# Patient Record
Sex: Male | Born: 1980 | Race: White | Hispanic: No | Marital: Single | Smoking: Never smoker
Health system: Southern US, Community
[De-identification: ages and names within clinical notes are randomized; demographics above are authoritative.]

## PROBLEM LIST (undated history)

## (undated) DIAGNOSIS — G40909 Epilepsy, unspecified, not intractable, without status epilepticus: Secondary | ICD-10-CM

---

## 2021-07-11 ENCOUNTER — Emergency Department (HOSPITAL_BASED_OUTPATIENT_CLINIC_OR_DEPARTMENT_OTHER)
Admission: EM | Admit: 2021-07-11 | Discharge: 2021-07-11 | Disposition: A | Discharge status: Home / Self Care | Payer: BLUE CROSS/BLUE SHIELD | Attending: Emergency Medicine | Admitting: Emergency Medicine

## 2021-07-11 ENCOUNTER — Emergency Department (HOSPITAL_BASED_OUTPATIENT_CLINIC_OR_DEPARTMENT_OTHER): Payer: BLUE CROSS/BLUE SHIELD

## 2021-07-11 ENCOUNTER — Other Ambulatory Visit: Payer: Self-pay

## 2021-07-11 ENCOUNTER — Encounter (HOSPITAL_BASED_OUTPATIENT_CLINIC_OR_DEPARTMENT_OTHER): Payer: Self-pay | Admitting: Emergency Medicine

## 2021-07-11 DIAGNOSIS — R072 Precordial pain: Secondary | ICD-10-CM | POA: Diagnosis not present

## 2021-07-11 HISTORY — DX: Epilepsy, unspecified, not intractable, without status epilepticus: G40.909

## 2021-07-11 LAB — TROPONIN I (HIGH SENSITIVITY): Troponin I (High Sensitivity): <2 ng/L (ref <18)

## 2021-07-11 MED ORDER — ACETAMINOPHEN 500 MG PO TABS
1000.0000 mg | ORAL_TABLET | Freq: Once | ORAL | Status: AC
  Administered 2021-07-11: 1000 mg via ORAL
  Filled 2021-07-11: qty 2

## 2021-07-11 MED ORDER — ALUM & MAG HYDROXIDE-SIMETH 200-200-20 MG/5ML PO SUSP
30.0000 mL | Freq: Once | ORAL | Status: AC
  Administered 2021-07-11: 30 mL via ORAL
  Filled 2021-07-11: qty 30

## 2021-07-11 MED ORDER — NAPROXEN 375 MG PO TABS: 375.0000 mg | ORAL_TABLET | Freq: Two times a day (BID) | ORAL | 0 refills | Status: AC

## 2021-07-11 NOTE — ED Provider Notes (Signed)
[facility] EMERGENCY DEPT Provider Note   CSN: [id number] Arrival date & time: 07/11/21  0421     History Chief Complaint  Patient presents with   Chest Pain    Jeff Charles is a 40 y.o. male.  The history is provided by the patient.  Chest Pain Pain location:  Substernal area Pain quality: sharp   Pain radiates to:  Does not radiate Pain severity:  Moderate Onset quality:  Sudden Duration:  8 hours Timing:  Constant Progression:  Unchanged Chronicity:  New Context: movement   Relieved by:  Nothing Worsened by:  Nothing Associated symptoms: no back pain, no claudication, no cough, no diaphoresis, no dysphagia, no fatigue, no fever, no heartburn, no lower extremity edema, no orthopnea, no palpitations, no shortness of breath, no syncope, no vomiting and no weakness   Associated symptoms comment:  No leg pain.  No travel, anywhere  No surgery.  No medications Risk factors: male sex   Started in [place].  No travel, no instrumentation.  No leg pain.  Nothing taken for symptoms. No covid symptoms no recent infection     Past Medical History:  Diagnosis Date   Epilepsy (HCC)     There are no problems to display for this patient.   History reviewed. No pertinent surgical history.     History reviewed. No pertinent family history.  Social History   Tobacco Use   Smoking status: Never   Smokeless tobacco: Never  Vaping Use   Vaping Use: Never used  Substance Use Topics   Drug use: Never    Home Medications Prior to Admission medications   Not on File    Allergies    Patient has no allergy information on record.  Review of Systems   Review of Systems  Constitutional:  Negative for diaphoresis, fatigue and fever.  HENT:  Negative for drooling and trouble swallowing.   Eyes:  Negative for redness.  Respiratory:  Negative for cough and shortness of breath.   Cardiovascular:  Positive for chest pain. Negative for palpitations, orthopnea,  claudication, leg swelling and syncope.  Gastrointestinal:  Negative for heartburn and vomiting.  Genitourinary:  Negative for difficulty urinating.  Musculoskeletal:  Negative for back pain.  Skin:  Negative for wound.  Neurological:  Negative for facial asymmetry and weakness.  Psychiatric/Behavioral:  Negative for agitation.    Physical Exam Updated Vital Signs BP (!) 138/94 (BP Location: Right Arm)   Pulse 94   Temp 98 F (36.7 C) (Oral)   Resp 18   Ht 6\' 2"  (1.88 m)   Wt 81.6 kg   SpO2 99%   BMI 23.11 kg/m   Physical Exam Vitals and nursing note reviewed.  Constitutional:      General: He is not in acute distress.    Appearance: Normal appearance.  HENT:     Head: Normocephalic and atraumatic.     Nose: Nose normal.  Eyes:     Conjunctiva/sclera: Conjunctivae normal.     Pupils: Pupils are equal, round, and reactive to light.  Cardiovascular:     Rate and Rhythm: Normal rate and regular rhythm.     Pulses: Normal pulses.     Heart sounds: Normal heart sounds.  Pulmonary:     Effort: Pulmonary effort is normal.     Breath sounds: Normal breath sounds.  Abdominal:     General: Abdomen is flat. Bowel sounds are normal.     Palpations: Abdomen is soft.     Tenderness: There  is no abdominal tenderness. There is no guarding.  Musculoskeletal:        General: Normal range of motion.     Cervical back: Normal range of motion and neck supple.  Skin:    General: Skin is warm and dry.     Capillary Refill: Capillary refill takes less than 2 seconds.  Neurological:     General: No focal deficit present.     Mental Status: He is alert and oriented to person, place, and time.     Deep Tendon Reflexes: Reflexes normal.  Psychiatric:        Mood and Affect: Mood normal.        Behavior: Behavior normal.    ED Results / Procedures / Treatments   Labs (all labs ordered are listed, but only abnormal results are displayed) Labs Reviewed  TROPONIN I (HIGH SENSITIVITY)     EKG  EKG Interpretation  Date/Time:  Tuesday July 11 2021 04:30:52 EDT Ventricular Rate:  92 PR Interval:  129 QRS Duration: 91 QT Interval:  352 QTC Calculation: 436 R Axis:   67 Text Interpretation: Sinus rhythm Confirmed by Nicanor Alcon, Jadene Stemmer ([id number]) on 07/11/2021 5:05:37 AM           Radiology No results found.  Procedures Procedures   Medications Ordered in ED Medications  acetaminophen (TYLENOL) tablet 1,000 mg (has no administration in time range)  alum & mag hydroxide-simeth (MAALOX/MYLANTA) 200-200-20 MG/5ML suspension 30 mL (has no administration in time range)    ED Course  I have reviewed the triage vital signs and the nursing notes.  Pertinent labs & imaging results that were available during my care of the patient were reviewed by me and considered in my medical decision making (see chart for details).  PERC negative wells 0, highly doubt PE in this low risk patient.  Given time course one negative EKG and troponin is sufficient to rule out ACS.   HEART score 1 very low risk for MACE  Jeff Charles was evaluated in Emergency Department on 07/11/2021 for the symptoms described in the history of present illness. He was evaluated in the context of the global COVID-19 pandemic, which necessitated consideration that the patient might be at risk for infection with the SARS-CoV-2 virus that causes COVID-19. Institutional protocols and algorithms that pertain to the evaluation of patients at risk for COVID-19 are in a state of rapid change based on information released by regulatory bodies including the CDC and federal and state organizations. These policies and algorithms were followed during the patient's care in the ED.  Final Clinical Impression(s) / ED Diagnoses Final diagnoses:  None     Return for intractable cough, coughing up blood, fevers > 100.4 unrelieved by medication, shortness of breath, intractable vomiting, chest pain, shortness of breath,  weakness, numbness, changes in speech, facial asymmetry, abdominal pain, passing out, Inability to tolerate liquids or food, cough, altered mental status or any concerns. No signs of systemic illness or infection. The patient is nontoxic-appearing on exam and vital signs are within normal limits. I have reviewed the triage vital signs and the nursing notes. Pertinent labs & imaging results that were available during my care of the patient were reviewed by me and considered in my medical decision making (see chart for details). After history, exam, and medical workup I feel the patient has been appropriately medically screened and is safe for discharge home. Pertinent diagnoses were discussed with the patient. Patient was given return precautions.    Rx /  DC Orders ED Discharge Orders     None        Kahlen Boyde, MD 07/11/21 [id number]

## 2021-07-11 NOTE — ED Triage Notes (Signed)
Pt via pov from home with chest pain when he takes a deep breath. Pt denies any sob or other symptoms. Pt states he is going on a business trip and wanted to get it checked out before he went. Pt alert & oriented, nad noted.

## 2022-09-03 IMAGING — DX DG CHEST 1V PORT
1 series · 2 of 2 positions shown · non-contrast
Comparison: None.

CLINICAL DATA: 40-year-old male with chest pain, pleuritic pain.

EXAM:
PORTABLE CHEST 1 VIEW

[Series 1: chest · 0.14mm/px · 2 of 2 slices shown]
[im 1/2]
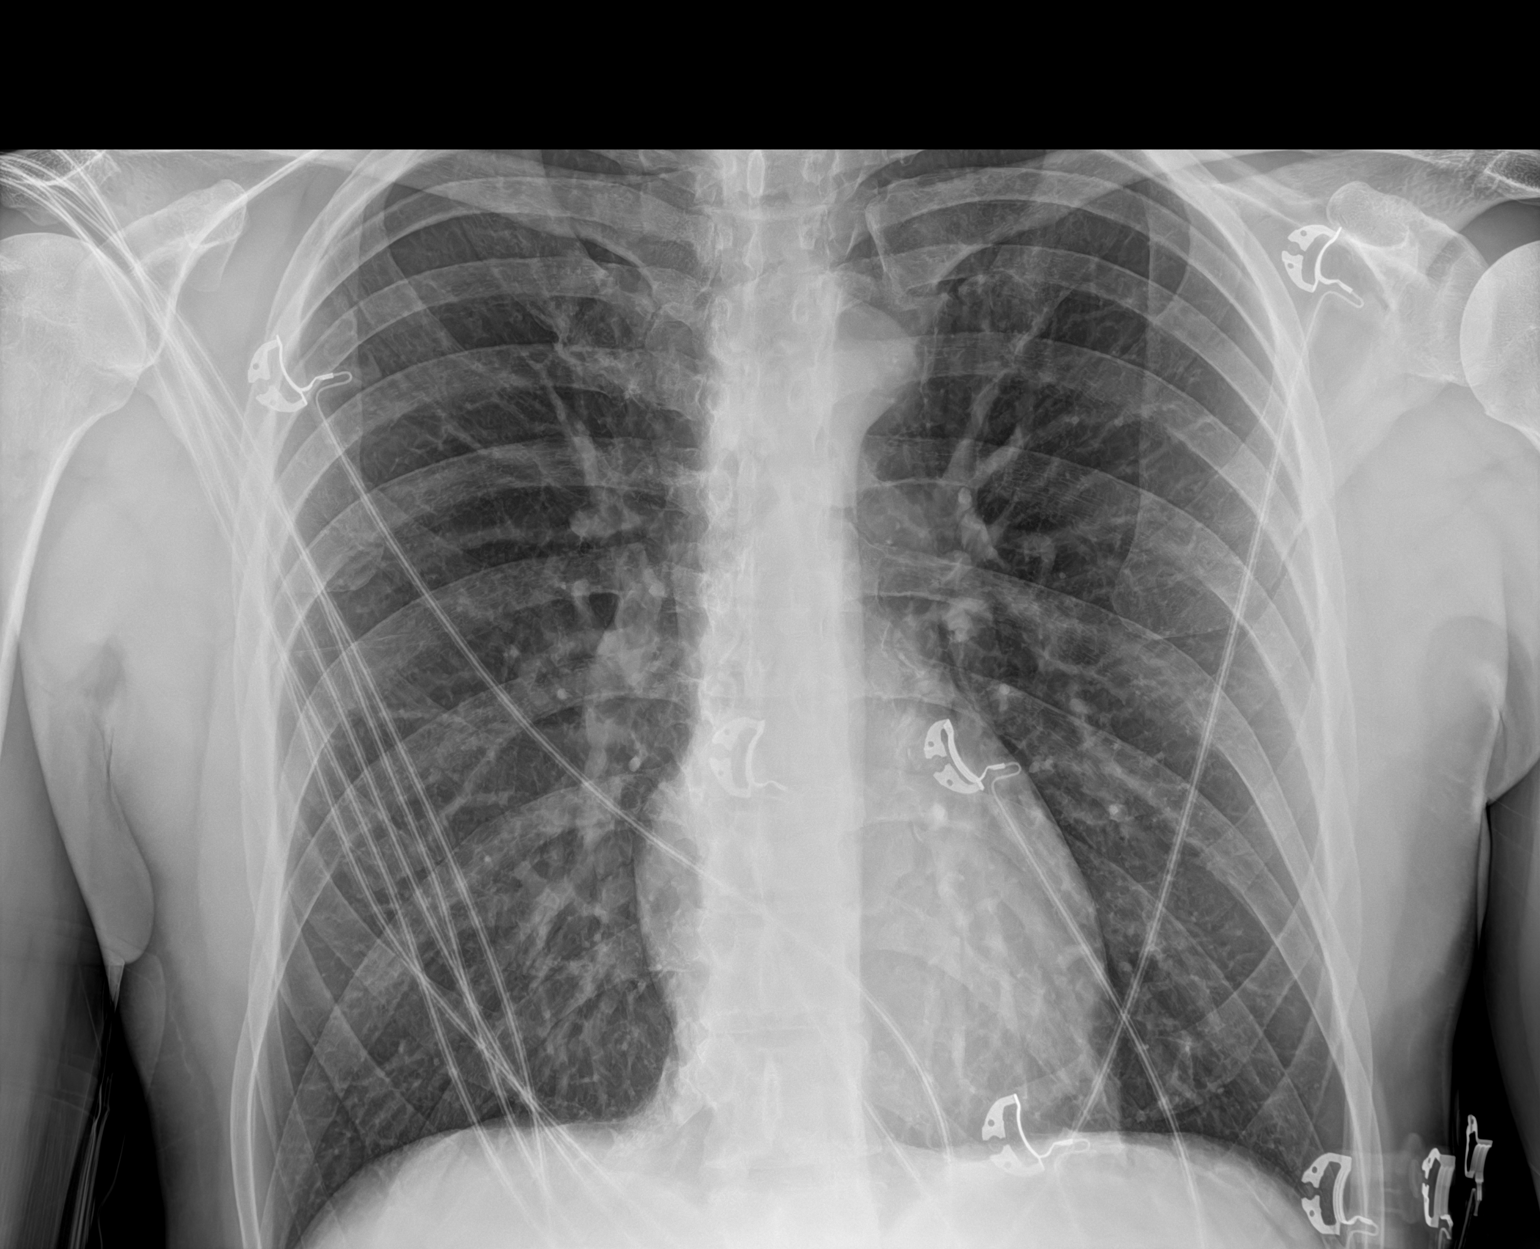
[im 2/2]
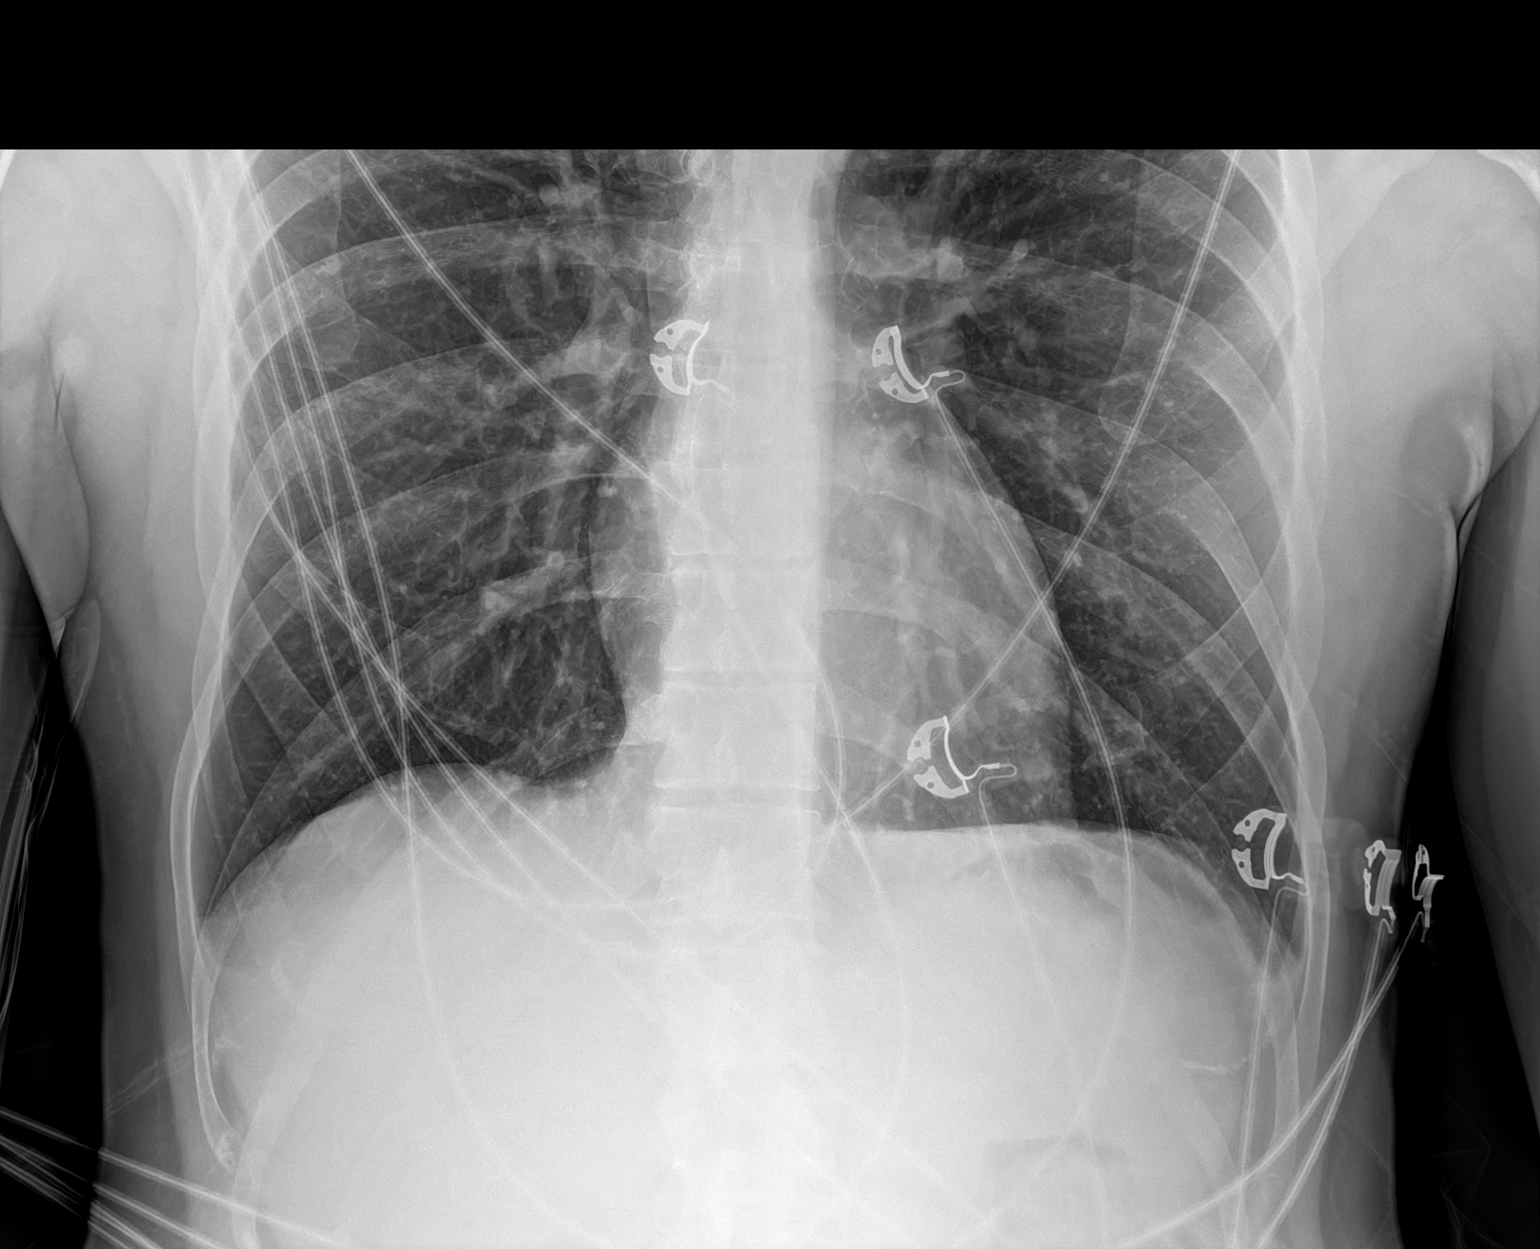

[2 of 2 positions shown; findings below may reference images not displayed]

FINDINGS: Portable AP semi upright view at 2116 hours. Lung volumes and
mediastinal contours are within normal limits. Visualized tracheal
air column is within normal limits. Allowing for portable technique
the lungs are clear. No pneumothorax or pleural effusion.

No osseous abnormality identified.  Negative visible bowel gas.
IMPRESSION: Negative portable chest.

## 2022-10-17 ENCOUNTER — Encounter (HOSPITAL_BASED_OUTPATIENT_CLINIC_OR_DEPARTMENT_OTHER): Payer: Self-pay | Admitting: Emergency Medicine

## 2022-10-17 ENCOUNTER — Emergency Department (HOSPITAL_BASED_OUTPATIENT_CLINIC_OR_DEPARTMENT_OTHER): Payer: BLUE CROSS/BLUE SHIELD

## 2022-10-17 ENCOUNTER — Emergency Department (HOSPITAL_BASED_OUTPATIENT_CLINIC_OR_DEPARTMENT_OTHER)
Admission: EM | Admit: 2022-10-17 | Discharge: 2022-10-17 | Disposition: A | Discharge status: Home / Self Care | Payer: BLUE CROSS/BLUE SHIELD | Attending: Emergency Medicine | Admitting: Emergency Medicine

## 2022-10-17 ENCOUNTER — Other Ambulatory Visit: Payer: Self-pay

## 2022-10-17 DIAGNOSIS — R519 Headache, unspecified: Secondary | ICD-10-CM | POA: Diagnosis not present

## 2022-10-17 DIAGNOSIS — R400 Somnolence: Secondary | ICD-10-CM | POA: Diagnosis not present

## 2022-10-17 LAB — CBC WITH DIFFERENTIAL/PLATELET
Abs Immature Granulocytes: 0.1 10*3/uL — ABNORMAL HIGH (ref 0.00–0.07)
Basophils Absolute: 0.1 10*3/uL (ref 0.0–0.1)
Basophils Relative: 1 %
Eosinophils Absolute: 0.1 10*3/uL (ref 0.0–0.5)
Eosinophils Relative: 1 %
HCT: 43.3 % (ref 39.0–52.0)
Hemoglobin: 14.5 g/dL (ref 13.0–17.0)
Immature Granulocytes: 2 %
Lymphocytes Relative: 18 %
Lymphs Abs: 1.2 10*3/uL (ref 0.7–4.0)
MCH: 29.3 pg (ref 26.0–34.0)
MCHC: 33.5 g/dL (ref 30.0–36.0)
MCV: 87.5 fL (ref 80.0–100.0)
Monocytes Absolute: 0.5 10*3/uL (ref 0.1–1.0)
Monocytes Relative: 8 %
Neutro Abs: 4.9 10*3/uL (ref 1.7–7.7)
Neutrophils Relative %: 70 %
Platelets: 279 10*3/uL (ref 150–400)
RBC: 4.95 MIL/uL (ref 4.22–5.81)
RDW: 13.9 % (ref 11.5–15.5)
WBC: 6.9 10*3/uL (ref 4.0–10.5)
nRBC: 0 % (ref 0.0–0.2)

## 2022-10-17 LAB — BASIC METABOLIC PANEL
Anion gap: 9 (ref 5–15)
BUN: 14 mg/dL (ref 6–20)
CO2: 30 mmol/L (ref 22–32)
Calcium: 10.1 mg/dL (ref 8.9–10.3)
Chloride: 100 mmol/L (ref 98–111)
Creatinine, Ser: 1.17 mg/dL (ref 0.61–1.24)
GFR, Estimated: >60 mL/min (ref >60)
Glucose, Bld: 130 mg/dL — ABNORMAL HIGH (ref 70–99)
Potassium: 3.9 mmol/L (ref 3.5–5.1)
Sodium: 139 mmol/L (ref 135–145)

## 2022-10-17 NOTE — ED Triage Notes (Signed)
Left eye twitching intermittently and left neck/head pain during those times for few days. Pt has hx of epilepsy, no recent change in meds and has been taking them. He says he googled his symptoms and is worried about brain tumor.

## 2022-10-17 NOTE — ED Provider Notes (Signed)
[facility] EMERGENCY DEPT Provider Note   CSN: [id number] Arrival date & time: 10/17/22  1407     History Chief Complaint  Patient presents with   Eye Twitching    HPI Jeff Charles is a 41 y.o. male presenting for multiple concerns.  He endorses intermittent headaches over the past 2 weeks.  Worsening left eye twitching, history of epilepsy and concern for progression of his epilepsy.  He denies fevers or chills, nausea vomiting, syncope or shortness of breath.  Is otherwise ambulatory tolerating p.o. intake. States that he has been worrying about having a brain tumor because of the worsening of his eye twitching.  He has a history of epilepsy and is concerned that these conditions are related.  He has been compliant on all medications. He endorses nighttime snoring, daytime somnolence, elevated blood pressures recently.  He has been evaluated by ENT for possible maxillary obstructions.  He has not been evaluated for sleep apnea.   Patient's recorded medical, surgical, social, medication list and allergies were reviewed in the Snapshot window as part of the initial history.   Review of Systems   Review of Systems  Constitutional:  Positive for fatigue. Negative for chills.  HENT:  Negative for ear pain and sore throat.   Eyes:  Negative for pain and visual disturbance.  Respiratory:  Negative for cough and shortness of breath.   Cardiovascular:  Negative for chest pain and palpitations.  Gastrointestinal:  Negative for abdominal pain and vomiting.  Genitourinary:  Negative for dysuria and hematuria.  Musculoskeletal:  Negative for arthralgias and back pain.  Skin:  Negative for color change and rash.  Neurological:  Negative for seizures and syncope.  All other systems reviewed and are negative.   Physical Exam Updated Vital Signs BP (!) 137/91   Pulse (!) 104   Temp 98.6 F (37 C) (Oral)   Resp 16   SpO2 100%  Physical Exam Vitals and nursing note  reviewed.  Constitutional:      General: He is not in acute distress.    Appearance: He is well-developed.  HENT:     Head: Normocephalic and atraumatic.  Eyes:     Conjunctiva/sclera: Conjunctivae normal.  Cardiovascular:     Rate and Rhythm: Normal rate and regular rhythm.     Heart sounds: No murmur heard. Pulmonary:     Effort: Pulmonary effort is normal. No respiratory distress.     Breath sounds: Normal breath sounds.  Abdominal:     Palpations: Abdomen is soft.     Tenderness: There is no abdominal tenderness.  Musculoskeletal:        General: No swelling.     Cervical back: Neck supple.  Skin:    General: Skin is warm and dry.     Capillary Refill: Capillary refill takes less than 2 seconds.  Neurological:     Mental Status: He is alert.  Psychiatric:        Mood and Affect: Mood normal.      ED Course/ Medical Decision Making/ A&P    Procedures Procedures   Medications Ordered in ED Medications - No data to display  Medical Decision Making:    Hillis Mcphatter is a 41 y.o. male who presented to the ED today with multiple complaints detailed above.     Patient's presentation is complicated by their history of epilepsy on Lamictal.  Patient placed on continuous vitals and telemetry monitoring while in ED which was reviewed periodically.   Complete initial physical  exam performed, notably the patient  was hemodynamically stable no acute distress.      Reviewed and confirmed nursing documentation for past medical history, family history, social history.    Initial Assessment:   With the patient's presentation of headaches, worsening twitches, somnolence, most likely diagnosis is developing sleep apnea with resulting hypertension, headaches. Other diagnoses were considered including (but not limited to) intracranial mass, progression of epilepsy, metabolic disturbance, infection. These are considered less likely due to history of present illness and physical exam  findings.   This is most consistent with an acute life/limb threatening illness complicated by underlying chronic conditions.  Initial Plan:  CT head to evaluate for new intracranial abnormality Screening labs including CBC and Metabolic panel to evaluate for infectious or metabolic etiology of disease.  Objective evaluation as below reviewed with plan for close reassessment  Initial Study Results:   Laboratory  All laboratory results reviewed without evidence of clinically relevant pathology.    Radiology  All images reviewed independently. Agree with radiology report at this time.   CT Head Wo Contrast  Result Date: 10/17/2022 CLINICAL DATA:  Seizures EXAM: CT HEAD WITHOUT CONTRAST TECHNIQUE: Contiguous axial images were obtained from the base of the skull through the vertex without intravenous contrast. RADIATION DOSE REDUCTION: This exam was performed according to the departmental dose-optimization program which includes automated exposure control, adjustment of the mA and/or kV according to patient size and/or use of iterative reconstruction technique. COMPARISON:  None Available. FINDINGS: Brain: No acute intracranial findings are seen in noncontrast CT brain. There are no signs of bleeding within the cranium. Ventricles are unremarkable. There is no focal edema or mass effect. Vascular: Unremarkable. Skull: Unremarkable. Sinuses/Orbits: There are no air-fluid levels or significant mucosal thickening. There is possible frothy density in the inferior aspect of left maxillary sinus. Other: None. IMPRESSION: No acute intracranial findings are seen in noncontrast CT brain. 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 Electronically Signed   By: Ernie Avena M.D.   On: 10/17/2022 18:05     Final Assessment and Plan:   Objective evaluation with no focal pathology.  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  He denies any symptoms of active  sinus infection.  He will plan to follow-up with ENT regarding this.  He will follow-up with his neurologist regarding his symptoms and will plan to follow-up with primary care provider for further workup in the outpatient setting for obstructive sleep apnea.   Disposition:  I have considered need for hospitalization, however, considering all of the above, I believe this patient is stable for discharge at this time.  Patient/family educated about specific return precautions for given chief complaint and symptoms.  Patient/family educated about follow-up with PCP.     Patient/family expressed understanding of return precautions and need for follow-up. Patient spoken to regarding all imaging and laboratory results and appropriate follow up for these results. All education provided in verbal form with additional information in written form. Time was allowed for answering of patient questions. Patient discharged.    Emergency Department Medication Summary:   Medications - No data to display       Clinical Impression:  1. Nonintractable headache, unspecified chronicity pattern, unspecified headache type      Discharge   Final Clinical Impression(s) / ED Diagnoses Final diagnoses:  Nonintractable headache, unspecified chronicity pattern, unspecified headache type    Rx / DC Orders ED Discharge Orders     None  Glyn Ade, MD 10/17/22 [phone]
